# Patient Record
Sex: Female | Born: 2000 | Race: White | Hispanic: No | Marital: Single | Smoking: Never smoker
Health system: Southern US, Community
[De-identification: ages and names within clinical notes are randomized; demographics above are authoritative.]

## PROBLEM LIST (undated history)

## (undated) DIAGNOSIS — F909 Attention-deficit hyperactivity disorder, unspecified type: Secondary | ICD-10-CM

## (undated) DIAGNOSIS — Q231 Congenital insufficiency of aortic valve: Secondary | ICD-10-CM

---
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
## 2022-03-25 ENCOUNTER — Emergency Department (HOSPITAL_BASED_OUTPATIENT_CLINIC_OR_DEPARTMENT_OTHER)
Admission: EM | Admit: 2022-03-25 | Discharge: 2022-03-25 | Disposition: A | Discharge status: Home / Self Care | Payer: Managed Care, Other (non HMO) | Attending: Emergency Medicine | Admitting: Emergency Medicine

## 2022-03-25 ENCOUNTER — Emergency Department (HOSPITAL_BASED_OUTPATIENT_CLINIC_OR_DEPARTMENT_OTHER): Payer: Managed Care, Other (non HMO)

## 2022-03-25 ENCOUNTER — Encounter (HOSPITAL_BASED_OUTPATIENT_CLINIC_OR_DEPARTMENT_OTHER): Payer: Self-pay

## 2022-03-25 DIAGNOSIS — R1031 Right lower quadrant pain: Secondary | ICD-10-CM | POA: Insufficient documentation

## 2022-03-25 LAB — COMPREHENSIVE METABOLIC PANEL
ALT: 24 U/L (ref 0–44)
AST: 17 U/L (ref 15–41)
Albumin: 3.8 g/dL (ref 3.5–5.0)
Alkaline Phosphatase: 60 U/L (ref 38–126)
Anion gap: 5 (ref 5–15)
BUN: 13 mg/dL (ref 6–20)
CO2: 24 mmol/L (ref 22–32)
Calcium: 9.3 mg/dL (ref 8.9–10.3)
Chloride: 108 mmol/L (ref 98–111)
Creatinine, Ser: 0.61 mg/dL (ref 0.44–1.00)
GFR, Estimated: >60 mL/min (ref >60)
Glucose, Bld: 111 mg/dL — ABNORMAL HIGH (ref 70–99)
Potassium: 3.7 mmol/L (ref 3.5–5.1)
Sodium: 137 mmol/L (ref 135–145)
Total Bilirubin: 0.3 mg/dL (ref 0.3–1.2)
Total Protein: 7.5 g/dL (ref 6.5–8.1)

## 2022-03-25 LAB — CBC
HCT: 38.4 % (ref 36.0–46.0)
Hemoglobin: 12.7 g/dL (ref 12.0–15.0)
MCH: 28.8 pg (ref 26.0–34.0)
MCHC: 33.1 g/dL (ref 30.0–36.0)
MCV: 87.1 fL (ref 80.0–100.0)
Platelets: 187 10*3/uL (ref 150–400)
RBC: 4.41 MIL/uL (ref 3.87–5.11)
RDW: 12.9 % (ref 11.5–15.5)
WBC: 7.2 10*3/uL (ref 4.0–10.5)
nRBC: 0 % (ref 0.0–0.2)

## 2022-03-25 LAB — URINALYSIS, ROUTINE W REFLEX MICROSCOPIC
Bilirubin Urine: NEGATIVE
Glucose, UA: NEGATIVE mg/dL
Ketones, ur: NEGATIVE mg/dL
Leukocytes,Ua: NEGATIVE
Nitrite: NEGATIVE
Protein, ur: 30 mg/dL — AB
Specific Gravity, Urine: 1.025 (ref 1.005–1.030)
pH: 7 (ref 5.0–8.0)

## 2022-03-25 LAB — LIPASE, BLOOD: Lipase: 28 U/L (ref 11–51)

## 2022-03-25 LAB — PREGNANCY, URINE: Preg Test, Ur: NEGATIVE

## 2022-03-25 LAB — URINALYSIS, MICROSCOPIC (REFLEX)

## 2022-03-25 MED ORDER — ONDANSETRON HCL 4 MG/2ML IJ SOLN
4.0000 mg | Freq: Once | INTRAMUSCULAR | Status: AC
  Administered 2022-03-25: 4 mg via INTRAVENOUS
  Filled 2022-03-25: qty 2

## 2022-03-25 MED ORDER — IOHEXOL 300 MG/ML  SOLN
100.0000 mL | Freq: Once | INTRAMUSCULAR | Status: AC | PRN
  Administered 2022-03-25: 100 mL via INTRAVENOUS

## 2022-03-25 MED ORDER — KETOROLAC TROMETHAMINE 15 MG/ML IJ SOLN
15.0000 mg | Freq: Once | INTRAMUSCULAR | Status: AC
  Administered 2022-03-25: 15 mg via INTRAVENOUS
  Filled 2022-03-25: qty 1

## 2022-03-25 NOTE — ED Triage Notes (Signed)
C/o RLQ abdominal pain with nausea since this morning. Tender to palpation. Tearful in triage.

## 2022-03-25 NOTE — ED Notes (Signed)
Pain meds given and CT contacted to return for scan.

## 2022-03-25 NOTE — Discharge Instructions (Addendum)
Please read and follow all provided instructions.  Your diagnoses today include:  1. Right lower quadrant abdominal pain     Tests performed today include: Blood cell counts and platelets Kidney and liver function tests Pancreas function test (called lipase) Urine test to look for infection A blood or urine test for pregnancy (women only) CT scan of the abdomen and pelvis: does not show any signs of problems today including appendicitis  Vital signs. See below for your results today.   Medications prescribed:  Please use over-the-counter NSAID medications (ibuprofen, naproxen) or Tylenol (acetaminophen) as directed on the packaging for pain -- as long as you do not have any reasons avoid these medications. Reasons to avoid NSAID medications include: weak kidneys, a history of bleeding in your stomach or gut, or uncontrolled high blood pressure or previous heart attack. Reasons to avoid Tylenol include: liver problems or ongoing alcohol use. Never take more than 4000mg  or 8 Extra strength Tylenol in a 24 hour period.     Take any prescribed medications only as directed.  Home care instructions:  Follow any educational materials contained in this packet.  Follow-up instructions: Please follow-up with your primary care provider in the next 3 days for further evaluation of your symptoms.    Return instructions:  SEEK IMMEDIATE MEDICAL ATTENTION IF: The pain does not go away or becomes severe  A temperature above 101F develops  Repeated vomiting occurs (multiple episodes)  The pain becomes localized to portions of the abdomen. The right side could possibly be appendicitis. In an adult, the left lower portion of the abdomen could be colitis or diverticulitis.  Blood is being passed in stools or vomit (bright red or black tarry stools)  You develop chest pain, difficulty breathing, dizziness or fainting, or become confused, poorly responsive, or inconsolable (young children) If you have  any other emergent concerns regarding your health  Additional Information: Abdominal (belly) pain can be caused by many things. Your caregiver performed an examination and possibly ordered blood/urine tests and imaging (CT scan, x-rays, ultrasound). Many cases can be observed and treated at home after initial evaluation in the emergency department. Even though you are being discharged home, abdominal pain can be unpredictable. Therefore, you need a repeated exam if your pain does not resolve, returns, or worsens. Most patients with abdominal pain don't have to be admitted to the hospital or have surgery, but serious problems like appendicitis and gallbladder attacks can start out as nonspecific pain. Many abdominal conditions cannot be diagnosed in one visit, so follow-up evaluations are very important.  Your vital signs today were: BP 121/72   Pulse 84   Temp 98.2 F (36.8 C) (Oral)   Resp 15   Ht 5\' 7"  (1.702 m)   Wt 70.3 kg   LMP 03/10/2022 (Approximate) Comment: neg upreg in er today.  SpO2 100%   BMI 24.28 kg/m  If your blood pressure (bp) was elevated above 135/85 this visit, please have this repeated by your doctor within one month. --------------

## 2022-03-25 NOTE — ED Provider Notes (Signed)
[facility] EMERGENCY DEPARTMENT Provider Note   CSN: [id number] Arrival date & time: 03/25/22  5956     History  Chief Complaint  Patient presents with   Abdominal Pain    Rebecca Tucker is a 21 y.o. female.  Patient with no past surgical history on the abdomen presents to the emergency department for evaluation of right lower abdominal pain which started this morning when she woke up around 6 AM.  Pain is sharp and stabbing, progressively worsening.  It does not radiate.  No associated nausea, vomiting, diarrhea.  No dysuria, increased frequency urgency or hematuria.  No vaginal bleeding or discharge.  She denies history of ovarian cysts.       Home Medications Prior to Admission medications   Medication Sig Start Date End Date Taking? Authorizing Provider  lisdexamfetamine (VYVANSE) 30 MG capsule Take 30 mg by mouth daily.    [provider]      Allergies    Patient has no known allergies.    Review of Systems   Review of Systems  Physical Exam Updated Vital Signs BP (!) 150/101 (BP Location: Left Arm)   Pulse 93   Temp 98 F (36.7 C) (Oral)   Resp 18   Ht 5\' 7"  (1.702 m)   Wt 70.3 kg   LMP 03/10/2022 (Approximate) Comment: neg upreg in er today.  SpO2 100%   BMI 24.28 kg/m  Physical Exam Vitals and nursing note reviewed.  Constitutional:      General: She is not in acute distress.    Appearance: She is well-developed.  HENT:     Head: Normocephalic and atraumatic.     Right Ear: External ear normal.     Left Ear: External ear normal.     Nose: Nose normal.  Eyes:     Conjunctiva/sclera: Conjunctivae normal.  Cardiovascular:     Rate and Rhythm: Normal rate and regular rhythm.     Heart sounds: No murmur heard. Pulmonary:     Effort: No respiratory distress.     Breath sounds: No wheezing, rhonchi or rales.  Abdominal:     Palpations: Abdomen is soft.     Tenderness: There is abdominal tenderness in the right lower quadrant.  There is no guarding or rebound. Negative signs include Murphy's sign and McBurney's sign.  Musculoskeletal:     Cervical back: Normal range of motion and neck supple.     Right lower leg: No edema.     Left lower leg: No edema.  Skin:    General: Skin is warm and dry.     Findings: No rash.  Neurological:     General: No focal deficit present.     Mental Status: She is alert. Mental status is at baseline.     Motor: No weakness.  Psychiatric:        Mood and Affect: Mood normal.     ED Results / Procedures / Treatments   Labs (all labs ordered are listed, but only abnormal results are displayed) Labs Reviewed  COMPREHENSIVE METABOLIC PANEL - Abnormal; Notable for the following components:      Result Value   Glucose, Bld 111 (*)    All other components within normal limits  URINALYSIS, ROUTINE W REFLEX MICROSCOPIC - Abnormal; Notable for the following components:   Hgb urine dipstick SMALL (*)    Protein, ur 30 (*)    All other components within normal limits  URINALYSIS, MICROSCOPIC (REFLEX) - Abnormal; Notable for the following  components:   Bacteria, UA MANY (*)    All other components within normal limits  LIPASE, BLOOD  CBC  PREGNANCY, URINE    EKG None  Radiology No results found.  Procedures Procedures    Medications Ordered in ED Medications  iohexol (OMNIPAQUE) 300 MG/ML solution 100 mL (has no administration in time range)  ketorolac (TORADOL) 15 MG/ML injection 15 mg (has no administration in time range)  ondansetron (ZOFRAN) injection 4 mg (has no administration in time range)    ED Course/ Medical Decision Making/ A&P    Patient seen and examined. History obtained directly from patient. Work-up including labs, imaging, EKG ordered in triage, if performed, were reviewed.    Labs/EKG: Independently reviewed and interpreted.  This included: CBC unremarkable; CMP unremarkable; lipase negative; pregnancy negative; UA without compelling signs of  infection  Imaging: Discussed imaging with patient.  Offered close monitoring with with return with worsening given reassuring lab work versus evaluation with CT imaging to evaluate for appendicitis or ovarian cyst, other etiology.  She would like to proceed with imaging today.  Medications/Fluids: Ordered: Toradol, Zofran  Most recent vital signs reviewed and are as follows: BP (!) 150/101 (BP Location: Left Arm)   Pulse 93   Temp 98 F (36.7 C) (Oral)   Resp 18   Ht 5\' 7"  (1.702 m)   Wt 70.3 kg   LMP 03/10/2022 (Approximate) Comment: neg upreg in er today.  SpO2 100%   BMI 24.28 kg/m   Initial impression: Right lower quadrant abdominal pain, nonspecific  1:46 PM Reassessment performed. Patient appears comfortable.  She says that the Toradol helped her pain.  Imaging personally visualized and interpreted including: CT abdomen pelvis, agree no acute findings  Reviewed pertinent lab work and imaging with patient at bedside. Questions answered.   Most current vital signs reviewed and are as follows: BP 121/72   Pulse 84   Temp 98.2 F (36.8 C) (Oral)   Resp 15   Ht 5\' 7"  (1.702 m)   Wt 70.3 kg   LMP 03/10/2022 (Approximate) Comment: neg upreg in er today.  SpO2 100%   BMI 24.28 kg/m   Plan: Discharge to home.   Prescriptions written for: None  Other home care instructions discussed: Suggested OTC ibuprofen and/or Tylenol for pain, heating pad  ED return instructions discussed: The patient was urged to return to the Emergency Department immediately with worsening of current symptoms, worsening abdominal pain, persistent vomiting, blood noted in stools, fever, or any other concerns. The patient verbalized understanding.   Follow-up instructions discussed: Patient encouraged to follow-up with their PCP in 3 days if symptoms are not improving.                            Medical Decision Making Amount and/or Complexity of Data Reviewed Labs: ordered. Radiology:  ordered.  Risk Prescription drug management.   For this patient's complaint of abdominal pain, the following conditions were considered on the differential diagnosis: gastritis/PUD, enteritis/duodenitis, appendicitis, cholelithiasis/cholecystitis, cholangitis, pancreatitis, ruptured viscus, colitis, diverticulitis, small/large bowel obstruction, proctitis, cystitis, pyelonephritis, ureteral colic, aortic dissection, aortic aneurysm. In women, ectopic pregnancy, pelvic inflammatory disease, ovarian cysts, and tubo-ovarian abscess were also considered. Atypical chest etiologies were also considered including ACS, PE, and pneumonia.   The patient's vital signs, pertinent lab work and imaging were reviewed and interpreted as discussed in the ED course. Hospitalization was considered for further testing, treatments, or serial exams/observation. However as  patient is well-appearing, has a stable exam, and reassuring studies today, I do not feel that they warrant admission at this time. This plan was discussed with the patient who verbalizes agreement and comfort with this plan and seems reliable and able to return to the Emergency Department with worsening or changing symptoms.          Final Clinical Impression(s) / ED Diagnoses Final diagnoses:  Right lower quadrant abdominal pain    Rx / DC Orders ED Discharge Orders     None         Renne Crigler, PA-C 03/25/22 1348    Terald Sleeper, MD 03/25/22 1527
